# Patient Record
Sex: Female | Born: 1990 | Race: White | Hispanic: No | Marital: Married | Smoking: Never smoker
Health system: Southern US, Community
[De-identification: ages and names within clinical notes are randomized; demographics above are authoritative.]

## PROBLEM LIST (undated history)

## (undated) DIAGNOSIS — J302 Other seasonal allergic rhinitis: Secondary | ICD-10-CM

## (undated) DIAGNOSIS — O24419 Gestational diabetes mellitus in pregnancy, unspecified control: Secondary | ICD-10-CM

## (undated) DIAGNOSIS — R519 Headache, unspecified: Secondary | ICD-10-CM

## (undated) DIAGNOSIS — R51 Headache: Secondary | ICD-10-CM

## (undated) HISTORY — PX: WISDOM TOOTH EXTRACTION: SHX21

---
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
## 2008-10-27 ENCOUNTER — Inpatient Hospital Stay (HOSPITAL_COMMUNITY): Admission: AD | Admit: 2008-10-27 | Discharge: 2008-10-29 | Payer: Self-pay | Admitting: Obstetrics and Gynecology

## 2010-04-04 ENCOUNTER — Emergency Department (HOSPITAL_COMMUNITY): Admission: EM | Admit: 2010-04-04 | Discharge: 2010-04-04 | Payer: Self-pay | Admitting: Emergency Medicine

## 2010-11-26 ENCOUNTER — Inpatient Hospital Stay (HOSPITAL_COMMUNITY)
Admission: RE | Admit: 2010-11-26 | Discharge: 2010-11-28 | DRG: 775 | Disposition: A | Discharge status: Home / Self Care | Payer: Medicaid Other | Source: Ambulatory Visit | Attending: Obstetrics and Gynecology | Admitting: Obstetrics and Gynecology

## 2010-11-26 LAB — RPR: RPR Ser Ql: REACTIVE — AB

## 2010-11-26 LAB — CBC
HCT: 37.7 % (ref 36.0–46.0)
Hemoglobin: 13 g/dL (ref 12.0–15.0)
MCH: 29.6 pg (ref 26.0–34.0)
MCHC: 34.5 g/dL (ref 30.0–36.0)
MCV: 85.9 fL (ref 78.0–100.0)
Platelets: 227 10*3/uL (ref 150–400)
RBC: 4.39 MIL/uL (ref 3.87–5.11)
RDW: 12.8 % (ref 11.5–15.5)
WBC: 8.8 10*3/uL (ref 4.0–10.5)

## 2010-11-26 LAB — RPR TITER: RPR Titer: 1:1 {titer}

## 2010-11-27 LAB — CBC
Platelets: 201 10*3/uL (ref 150–400)
RDW: 13 % (ref 11.5–15.5)
WBC: 11.3 10*3/uL — ABNORMAL HIGH (ref 4.0–10.5)

## 2010-11-27 LAB — T.PALLIDUM AB, IGG: T pallidum Antibodies (TP-PA): 0.03 S/CO (ref <0.90)

## 2010-12-01 NOTE — Discharge Summary (Signed)
  [name], [name]                ACCOUNT NO.:  [id number]  MEDICAL RECORD NO.:  [id number]           PATIENT TYPE:  I  LOCATION:  9121                          FACILITY:  [facility]  PHYSICIAN:  Huel Cote, M.D. DATE OF BIRTH:  12/23/1990  DATE OF ADMISSION:  11/26/2010 DATE OF DISCHARGE:  11/28/2010                              DISCHARGE SUMMARY   DISCHARGE DIAGNOSES: 1. Term pregnancy at 39+ weeks, delivered. 2. Status post normal spontaneous vaginal delivery.  DISCHARGE MEDICATIONS: 1. Motrin 600 mg p.o. every 6 hours. 2. Percocet one to two tablets p.o. every 4 hours p.r.n.  DISCHARGE FOLLOWUP:  The patient is to follow up in the office in 6 weeks for her full postpartum exam.  Discharge hemoglobin 11.2.  HOSPITAL COURSE:  The patient is a 20 year old G2, P1-0-0-1 who came in at [redacted] weeks gestation for induction of labor given term status in a favorable cervix.  Prenatal care was overall uncomplicated.  She had a false positive RPR and her 1-hour Glucola was elevated, but she had a 3- hour Glucola performed at 29 and 33 weeks, which was normal except for one elevated value on both.  She plans Mirena postpartum.  Prenatal labs are as follows:  A+ antibody negative, rubella equivocal, RPR positive, but treponemal test was negative, hepatitis B surface antigen negative, HIV negative, GC negative, Chlamydia negative, 1-hour Glucola 145, 3-hour Glucola normal x2, group B strep negative, first trimester screen normal.  PAST OBSTETRICAL HISTORY:  In 2010, she had a 39-week delivery of a 7 pounds and 9 ounces infant.  PAST GYN HISTORY:  None.  PAST SURGICAL HISTORY:  None.  PAST MEDICAL HISTORY:  Depression in the past, requiring no medications currently.  MEDICATIONS:  Prenatal vitamins.  There are no allergies.  She has no tobacco, alcohol or drugs.  PHYSICAL EXAMINATION:  On admission, she is afebrile with stable vital signs.  Cervix was 60-70% effaced, 3+ cm and  a -2 station.  She had rupture of membranes with clear fluid noted.  She progressed quickly throughout morning reached complete dilation and pushed well with a normal spontaneous vaginal delivery of a vigorous female infant over an intact perineum, Apgars were 8 and 9, weight is 8 pounds and 10 ounces. Placenta delivery was spontaneous.  Cervix and rectum were intact.  The patient was admitted for routine postpartum care and did well on postpartum day #2.  Her pain was well controlled.  Bleeding was normal and she was felt stable for discharge home.  She was given instructions on pelvic rest and planned a Mirena at her 6-week postpartum visit.  She will also be bring the baby to the office for outpatient circumcision next week.     Huel Cote, M.D.     KR/MEDQ  D:  11/28/2010  T:  11/29/2010  Job:  [id number]  Electronically Signed by Huel Cote M.D. on 12/01/2010 10:43:22 AM

## 2011-01-01 LAB — CBC
HCT: 35.7 % — ABNORMAL LOW (ref 36.0–46.0)
Hemoglobin: 12 g/dL (ref 12.0–15.0)
MCHC: 33.5 g/dL (ref 30.0–36.0)
Platelets: 229 10*3/uL (ref 150–400)
RBC: 3.96 MIL/uL (ref 3.87–5.11)
RBC: 4.1 MIL/uL (ref 3.87–5.11)
WBC: 10.9 10*3/uL — ABNORMAL HIGH (ref 4.0–10.5)
WBC: 13.3 10*3/uL — ABNORMAL HIGH (ref 4.0–10.5)

## 2011-01-01 LAB — RPR: RPR Ser Ql: NONREACTIVE

## 2011-01-29 NOTE — Discharge Summary (Signed)
NAMELAWAN, NANEZ                ACCOUNT NO.:  [id number]   MEDICAL RECORD NO.:  [id number]          PATIENT TYPE:  INP   LOCATION:  9105                          FACILITY:  [facility]   PHYSICIAN:  Malachi Pro. Ambrose Mantle, M.D. DATE OF BIRTH:  January 04, 1991   DATE OF ADMISSION:  10/27/2008  DATE OF DISCHARGE:                               DISCHARGE SUMMARY   A 20 year old white female para 0 gravida 1, estimated gestational age  [redacted] weeks by last period compatible with a 8-week ultrasound with [facility]  November 03, 2008, presented complaining of spontaneous rupture of  membranes at home at approximately 3:30 a.m., occasional contractions.  She was evaluated in the maternity admission unit, rupture of membranes  was confirmed, the cervix was 2-3 cm dilated.  Blood group and type was  A positive with a negative antibody, RPR nonreactive, hepatitis B  surface antigen negative, rubella equivocal.  The patient had an ASCUS  Pap smear with positive HPV.  GC and chlamydia negative, cystic fibrosis  negative.  First trimester screen normal.  Maternal serum alpha  fetoprotein was normal.  A 1-hour Glucola 129.  Group B strep negative.  Prenatal care was essentially uncomplicated.   PAST MEDICAL HISTORY:  No known drug allergies.  No surgeries.  History  of depression, on no medications.   PHYSICAL EXAMINATION:  VITAL SIGNS:  On admission, afebrile with normal  vital signs.  HEART/LUNGS:  Normal.  ABDOMEN:  Soft, gravid, nontender.  Estimated fetal weight 8 pounds.  Cervix 2-3 cm dilated per the RN, vertex presentation.  Pitocin  augmentation was begun.  By 1:40 p.m., the cervix was 3 cm, completely  effaced, vertex at a -1 station per the RN.  At 5:50 p.m., the cervix  was 5-6 cm.  The patient progressed to complete dilatation and pushed  well.  She had a spontaneous vaginal delivery of a living female infant,  7 pounds 9 ounces, Apgars of 9 at 1 and 9 at 5 minutes.  Dr. Jackelyn Knife  and was in attendance.   Placenta was spontaneous and intact.  She did  have a cord blood donation, right labial laceration, and small first-  degree perineal lacerations were hemostatic and not repaired.  Mild  uterine atony responded to massage, Pitocin, and IM Methergine.  Blood  loss about 600 mL.  Postpartum, the patient did very well and was  discharged on the second postpartum day.  RPR was nonreactive.  Admission hemoglobin 12.4, hematocrit 37, white count 10,900, platelet  count 257,000.  Followup hemoglobin 12.0, hematocrit 35.7.   FINAL DIAGNOSES:  Intrauterine pregnancy at 39 weeks, delivered vertex,  premature rupture of membranes.   OPERATION:  Spontaneous delivery vertex.   FINAL CONDITION:  Improved.   INSTRUCTIONS:  Our regular discharge instruction booklet.  The patient  declines analgesics for discharge.  She is asked to return in 6 weeks  for followup examination.      Malachi Pro. Ambrose Mantle, M.D.  Electronically Signed    TFH/MEDQ  D:  10/29/2008  T:  10/29/2008  Job:  [id number]

## 2011-08-31 ENCOUNTER — Emergency Department: Payer: Self-pay | Admitting: Unknown Physician Specialty

## 2015-09-17 NOTE — L&D Delivery Note (Signed)
Delivery Note Pt reached complete and pushed great.  At 4:04 PM a healthy female was delivered via Vaginal, Spontaneous Delivery (Presentation: OA  ).  APGAR: 8, 9; weight pending .   Placenta status:  delivered spontaneously, .  Cord:  with the following complications:nuchal x 1 reduced .    Anesthesia:  epidural Episiotomy: None Lacerations: None Suture Repair: n/a Est. Blood Loss (mL): 200ml  Mom to postpartum.  Baby to Couplet care / Skin to Skin.  D/w pt ppBTL in detail.. D/w her risks of surgical injusry to surrounding bowel and bladder and also risk of failure of 1:100.  We discussed increased risk of ectopic should pregnancy occur.  OR notified, has cases going on this PM.  Available 1300 pm on 05/16/16.  Dr. Mindi SlickerBanga agrees to perform.    Oliver PilaRICHARDSON,Caz Weaver W 05/15/2016, 4:34 PM

## 2016-03-05 LAB — OB RESULTS CONSOLE HIV ANTIBODY (ROUTINE TESTING): HIV: NONREACTIVE

## 2016-03-05 LAB — OB RESULTS CONSOLE HEPATITIS B SURFACE ANTIGEN: Hepatitis B Surface Ag: NEGATIVE

## 2016-03-05 LAB — OB RESULTS CONSOLE GC/CHLAMYDIA
Chlamydia: NEGATIVE
GC PROBE AMP, GENITAL: NEGATIVE

## 2016-03-05 LAB — OB RESULTS CONSOLE RPR: RPR: NONREACTIVE

## 2016-03-05 LAB — OB RESULTS CONSOLE RUBELLA ANTIBODY, IGM: Rubella: IMMUNE

## 2016-03-18 ENCOUNTER — Encounter: Payer: Medicaid Other | Attending: Obstetrics and Gynecology | Admitting: Skilled Nursing Facility1

## 2016-03-18 ENCOUNTER — Encounter: Payer: Self-pay | Admitting: Skilled Nursing Facility1

## 2016-03-18 VITALS — Ht 63.0 in | Wt 221.0 lb

## 2016-03-18 DIAGNOSIS — Z713 Dietary counseling and surveillance: Secondary | ICD-10-CM | POA: Insufficient documentation

## 2016-03-18 DIAGNOSIS — O24410 Gestational diabetes mellitus in pregnancy, diet controlled: Secondary | ICD-10-CM

## 2016-03-18 DIAGNOSIS — O99810 Abnormal glucose complicating pregnancy: Secondary | ICD-10-CM | POA: Diagnosis present

## 2016-03-18 NOTE — Progress Notes (Signed)
  Patient was seen on 03/18/2016 for Gestational Diabetes self-management class at the [facility]. The following learning objectives were met by the patient during this course:   States the definition of Gestational Diabetes  States why dietary management is important in controlling blood glucose  Describes the effects each nutrient has on blood glucose levels  Demonstrates ability to create a balanced meal plan  Demonstrates carbohydrate counting   States when to check blood glucose levels involving a total of 4 separate occurences in a day  Demonstrates proper blood glucose monitoring techniques  States the effect of stress and exercise on blood glucose levels  States the importance of limiting caffeine and abstaining from alcohol and smoking  Demonstrates the knowledge the glucometer provided in class may not be covered by their insurance and to call their insurance provider immediately after class to know which glucometer their insurance provider does cover as well as calling their physician the next day for a prescription to the glucometer their insurance does cover (if the one provided is not) as well as the lancets and strips for that meter.  Blood glucose monitor given: accucheck guide Lot # [id number] Exp: 04/02/2017 Blood glucose reading: 92  Patient instructed to monitor glucose levels: FBS: 60 - <90 1 hour: <140 2 hour: <120  *Patient received handouts:  Nutrition Diabetes and Pregnancy  Carbohydrate Counting List  Patient will be seen for follow-up as needed.

## 2016-05-03 LAB — OB RESULTS CONSOLE GBS: STREP GROUP B AG: NEGATIVE

## 2016-05-15 ENCOUNTER — Encounter (HOSPITAL_COMMUNITY): Payer: Self-pay | Admitting: *Deleted

## 2016-05-15 ENCOUNTER — Inpatient Hospital Stay (HOSPITAL_COMMUNITY): Payer: Medicaid Other | Admitting: Anesthesiology

## 2016-05-15 ENCOUNTER — Inpatient Hospital Stay (HOSPITAL_COMMUNITY)
Admission: AD | Admit: 2016-05-15 | Discharge: 2016-05-17 | DRG: 767 | Disposition: A | Discharge status: Home / Self Care | Payer: Medicaid Other | Source: Ambulatory Visit | Attending: Obstetrics and Gynecology | Admitting: Obstetrics and Gynecology

## 2016-05-15 DIAGNOSIS — Z302 Encounter for sterilization: Secondary | ICD-10-CM | POA: Diagnosis not present

## 2016-05-15 DIAGNOSIS — Z3A37 37 weeks gestation of pregnancy: Secondary | ICD-10-CM

## 2016-05-15 DIAGNOSIS — IMO0001 Reserved for inherently not codable concepts without codable children: Secondary | ICD-10-CM

## 2016-05-15 DIAGNOSIS — O24425 Gestational diabetes mellitus in childbirth, controlled by oral hypoglycemic drugs: Principal | ICD-10-CM | POA: Diagnosis present

## 2016-05-15 DIAGNOSIS — Z3483 Encounter for supervision of other normal pregnancy, third trimester: Secondary | ICD-10-CM | POA: Diagnosis present

## 2016-05-15 HISTORY — DX: Other seasonal allergic rhinitis: J30.2

## 2016-05-15 HISTORY — DX: Headache, unspecified: R51.9

## 2016-05-15 HISTORY — DX: Headache: R51

## 2016-05-15 HISTORY — DX: Gestational diabetes mellitus in pregnancy, unspecified control: O24.419

## 2016-05-15 LAB — CBC
HCT: 38.9 % (ref 36.0–46.0)
Hemoglobin: 14.1 g/dL (ref 12.0–15.0)
MCH: 30.9 pg (ref 26.0–34.0)
MCHC: 36.2 g/dL — ABNORMAL HIGH (ref 30.0–36.0)
MCV: 85.3 fL (ref 78.0–100.0)
PLATELETS: 251 10*3/uL (ref 150–400)
RBC: 4.56 MIL/uL (ref 3.87–5.11)
RDW: 12.8 % (ref 11.5–15.5)
WBC: 13.1 10*3/uL — AB (ref 4.0–10.5)

## 2016-05-15 MED ORDER — EPHEDRINE 5 MG/ML INJ
10.0000 mg | INTRAVENOUS | Status: DC | PRN
  Filled 2016-05-15: qty 4

## 2016-05-15 MED ORDER — OXYTOCIN 40 UNITS IN LACTATED RINGERS INFUSION - SIMPLE MED
2.5000 [IU]/h | INTRAVENOUS | Status: DC
  Filled 2016-05-15: qty 1000

## 2016-05-15 MED ORDER — OXYCODONE HCL 5 MG PO TABS
5.0000 mg | ORAL_TABLET | ORAL | Status: DC | PRN
  Administered 2016-05-15 – 2016-05-16 (×3): 5 mg via ORAL
  Filled 2016-05-15 (×3): qty 1

## 2016-05-15 MED ORDER — DIBUCAINE 1 % RE OINT
1.0000 | TOPICAL_OINTMENT | RECTAL | Status: DC | PRN
Start: 2016-05-15 — End: 2016-05-17

## 2016-05-15 MED ORDER — IBUPROFEN 600 MG PO TABS
600.0000 mg | ORAL_TABLET | Freq: Four times a day (QID) | ORAL | Status: DC
  Administered 2016-05-15 – 2016-05-17 (×6): 600 mg via ORAL
  Filled 2016-05-15 (×6): qty 1

## 2016-05-15 MED ORDER — TETANUS-DIPHTH-ACELL PERTUSSIS 5-2.5-18.5 LF-MCG/0.5 IM SUSP: 0.5000 mL | Freq: Once | INTRAMUSCULAR | Status: DC

## 2016-05-15 MED ORDER — SIMETHICONE 80 MG PO CHEW: 80.0000 mg | CHEWABLE_TABLET | ORAL | Status: DC | PRN

## 2016-05-15 MED ORDER — OXYTOCIN BOLUS FROM INFUSION: 500.0000 mL | Freq: Once | INTRAVENOUS | Status: DC

## 2016-05-15 MED ORDER — ACETAMINOPHEN 325 MG PO TABS: 650.0000 mg | ORAL_TABLET | ORAL | Status: DC | PRN

## 2016-05-15 MED ORDER — LACTATED RINGERS IV SOLN
500.0000 mL | Freq: Once | INTRAVENOUS | Status: AC
  Administered 2016-05-15: 500 mL via INTRAVENOUS

## 2016-05-15 MED ORDER — PHENYLEPHRINE 40 MCG/ML (10ML) SYRINGE FOR IV PUSH (FOR BLOOD PRESSURE SUPPORT)
80.0000 ug | PREFILLED_SYRINGE | INTRAVENOUS | Status: DC | PRN
  Filled 2016-05-15: qty 5

## 2016-05-15 MED ORDER — ONDANSETRON HCL 4 MG/2ML IJ SOLN: 4.0000 mg | INTRAMUSCULAR | Status: DC | PRN

## 2016-05-15 MED ORDER — FENTANYL 2.5 MCG/ML BUPIVACAINE 1/10 % EPIDURAL INFUSION (WH - ANES)
14.0000 mL/h | INTRAMUSCULAR | Status: DC | PRN
  Administered 2016-05-15: 14 mL/h via EPIDURAL
  Filled 2016-05-15 (×2): qty 125

## 2016-05-15 MED ORDER — FLEET ENEMA 7-19 GM/118ML RE ENEM: 1.0000 | ENEMA | RECTAL | Status: DC | PRN

## 2016-05-15 MED ORDER — ONDANSETRON HCL 4 MG PO TABS: 4.0000 mg | ORAL_TABLET | ORAL | Status: DC | PRN

## 2016-05-15 MED ORDER — WITCH HAZEL-GLYCERIN EX PADS: 1.0000 "application " | MEDICATED_PAD | CUTANEOUS | Status: DC | PRN

## 2016-05-15 MED ORDER — FENTANYL 2.5 MCG/ML BUPIVACAINE 1/10 % EPIDURAL INFUSION (WH - ANES)
14.0000 mL/h | INTRAMUSCULAR | Status: DC | PRN
  Administered 2016-05-15: 14 mL/h via EPIDURAL

## 2016-05-15 MED ORDER — LACTATED RINGERS IV SOLN: 500.0000 mL | INTRAVENOUS | Status: DC | PRN

## 2016-05-15 MED ORDER — LIDOCAINE HCL (PF) 1 % IJ SOLN
30.0000 mL | INTRAMUSCULAR | Status: DC | PRN
  Filled 2016-05-15: qty 30

## 2016-05-15 MED ORDER — OXYCODONE HCL 5 MG PO TABS: 10.0000 mg | ORAL_TABLET | ORAL | Status: DC | PRN

## 2016-05-15 MED ORDER — COCONUT OIL OIL: 1.0000 "application " | TOPICAL_OIL | Status: DC | PRN

## 2016-05-15 MED ORDER — PRENATAL MULTIVITAMIN CH
1.0000 | ORAL_TABLET | Freq: Every day | ORAL | Status: DC
  Administered 2016-05-17: 1 via ORAL
  Filled 2016-05-15: qty 1

## 2016-05-15 MED ORDER — OXYCODONE-ACETAMINOPHEN 5-325 MG PO TABS: 2.0000 | ORAL_TABLET | ORAL | Status: DC | PRN

## 2016-05-15 MED ORDER — ONDANSETRON HCL 4 MG/2ML IJ SOLN: 4.0000 mg | Freq: Four times a day (QID) | INTRAMUSCULAR | Status: DC | PRN

## 2016-05-15 MED ORDER — LIDOCAINE HCL (PF) 1 % IJ SOLN
INTRAMUSCULAR | Status: DC | PRN
Start: 2016-05-15 — End: 2016-05-15
  Administered 2016-05-15 (×2): 5 mL

## 2016-05-15 MED ORDER — OXYTOCIN 40 UNITS IN LACTATED RINGERS INFUSION - SIMPLE MED
1.0000 m[IU]/min | INTRAVENOUS | Status: DC
  Administered 2016-05-15: 2 m[IU]/min via INTRAVENOUS

## 2016-05-15 MED ORDER — LACTATED RINGERS IV SOLN
INTRAVENOUS | Status: DC
  Administered 2016-05-16 (×2): via INTRAVENOUS

## 2016-05-15 MED ORDER — TERBUTALINE SULFATE 1 MG/ML IJ SOLN
0.2500 mg | Freq: Once | INTRAMUSCULAR | Status: DC | PRN
  Filled 2016-05-15: qty 1

## 2016-05-15 MED ORDER — SOD CITRATE-CITRIC ACID 500-334 MG/5ML PO SOLN: 30.0000 mL | ORAL | Status: DC | PRN

## 2016-05-15 MED ORDER — LACTATED RINGERS IV SOLN
INTRAVENOUS | Status: DC
Start: 2016-05-15 — End: 2016-05-15
  Administered 2016-05-15 (×2): via INTRAVENOUS

## 2016-05-15 MED ORDER — BENZOCAINE-MENTHOL 20-0.5 % EX AERO
1.0000 | INHALATION_SPRAY | CUTANEOUS | Status: DC | PRN
Start: 2016-05-15 — End: 2016-05-17

## 2016-05-15 MED ORDER — PHENYLEPHRINE 40 MCG/ML (10ML) SYRINGE FOR IV PUSH (FOR BLOOD PRESSURE SUPPORT)
80.0000 ug | PREFILLED_SYRINGE | INTRAVENOUS | Status: DC | PRN
  Filled 2016-05-15: qty 10
  Filled 2016-05-15: qty 5

## 2016-05-15 MED ORDER — OXYCODONE-ACETAMINOPHEN 5-325 MG PO TABS
1.0000 | ORAL_TABLET | ORAL | Status: DC | PRN
Start: 2016-05-15 — End: 2016-05-15

## 2016-05-15 MED ORDER — DIPHENHYDRAMINE HCL 50 MG/ML IJ SOLN: 12.5000 mg | INTRAMUSCULAR | Status: DC | PRN

## 2016-05-15 MED ORDER — ZOLPIDEM TARTRATE 5 MG PO TABS: 5.0000 mg | ORAL_TABLET | Freq: Every evening | ORAL | Status: DC | PRN

## 2016-05-15 MED ORDER — LACTATED RINGERS IV SOLN: 500.0000 mL | Freq: Once | INTRAVENOUS | Status: DC

## 2016-05-15 MED ORDER — DIPHENHYDRAMINE HCL 25 MG PO CAPS: 25.0000 mg | ORAL_CAPSULE | Freq: Four times a day (QID) | ORAL | Status: DC | PRN

## 2016-05-15 MED ORDER — SENNOSIDES-DOCUSATE SODIUM 8.6-50 MG PO TABS
2.0000 | ORAL_TABLET | ORAL | Status: DC
  Administered 2016-05-15: 2 via ORAL
  Filled 2016-05-15: qty 2

## 2016-05-15 NOTE — Anesthesia Procedure Notes (Signed)
Epidural Patient location during procedure: OB Start time: 05/15/2016 10:17 AM End time: 05/15/2016 10:22 AM  Staffing Anesthesiologist: Bonita QuinGUIDETTI, Khrystal Jeanmarie S Performed: anesthesiologist   Preanesthetic Checklist Completed: patient identified, site marked, surgical consent, pre-op evaluation, timeout performed, IV checked, risks and benefits discussed and monitors and equipment checked  Epidural Patient position: sitting Prep: site prepped and draped and DuraPrep Patient monitoring: continuous pulse ox and blood pressure Approach: midline Location: L4-L5 Injection technique: LOR air  Needle:  Needle type: Tuohy  Needle gauge: 17 G Needle length: 9 cm and 9 Needle insertion depth: 5 cm cm Catheter type: closed end flexible Catheter size: 19 Gauge Catheter at skin depth: 12 cm Test dose: negative  Assessment Events: blood not aspirated, injection not painful, no injection resistance, negative IV test and no paresthesia

## 2016-05-15 NOTE — Anesthesia Rounding Note (Signed)
  CRNA Epidural Rounding Note  Patient: [name], 25 y.o., female  Patient's current pain level: 0  Agreed upon pain management level: 4  Epidural intervention: No   Comments: Patient comfortable  [facility] City MckinneyEIGHT,[name] 05/15/2016

## 2016-05-15 NOTE — Anesthesia Preprocedure Evaluation (Signed)
Anesthesia Evaluation  Patient identified by MRN, date of birth, ID band Patient awake    Reviewed: Allergy & Precautions, NPO status , Patient's Chart, lab work & pertinent test results  Airway Mallampati: III  TM Distance: >3 FB Neck ROM: Full    Dental no notable dental hx.    Pulmonary neg pulmonary ROS,    Pulmonary exam normal        Cardiovascular negative cardio ROS Normal cardiovascular exam     Neuro/Psych negative neurological ROS  negative psych ROS   GI/Hepatic negative GI ROS, Neg liver ROS,   Endo/Other  diabetes, Gestational  Renal/GU negative Renal ROS  negative genitourinary   Musculoskeletal negative musculoskeletal ROS (+)   Abdominal   Peds negative pediatric ROS (+)  Hematology negative hematology ROS (+)   Anesthesia Other Findings   Reproductive/Obstetrics (+) Pregnancy                             Anesthesia Physical Anesthesia Plan  ASA: II  Anesthesia Plan: Epidural   Post-op Pain Management:    Induction:   Airway Management Planned:   Additional Equipment:   Intra-op Plan:   Post-operative Plan:   Informed Consent:   Plan Discussed with:   Anesthesia Plan Comments:         Anesthesia Quick Evaluation

## 2016-05-15 NOTE — H&P (Signed)
Gabriela ReapCarolyn Plaugher is a 25 y.o. female G3P2002 at 4637 2/7 weeks (EDD 06/03/16 by    presenting for contractions and ROM in active labor.  Cervix 5cm on admission.Prenatal care complicated by GDM which required oral agent to control.  She is on metformin 500mg  po BID since about 33 weeks with reasonable control.  She also had a false positive RPR with negative treponemal test.  She would like permanent sterilization either with a pp or interval tubal.    OB History    Gravida Para Term Preterm AB Living   3 2 2     2    SAB TAB Ectopic Multiple Live Births           2    NSVD 2010  NSVD 2012  Past Medical History:  Diagnosis Date  . Gestational diabetes   . Headache   . Seasonal allergies    Past Surgical History:  Procedure Laterality Date  . WISDOM TOOTH EXTRACTION     Family History: family history is not on file. Social History:  reports that she has never smoked. She has never used smokeless tobacco. She reports that she does not drink alcohol or use drugs.     Maternal Diabetes: Yes:  Diabetes Type:  Insulin/Medication controlled Genetic Screening: Normal Maternal Ultrasounds/Referrals: Normal Fetal Ultrasounds or other Referrals:  None Maternal Substance Abuse:  No Significant Maternal Medications:  Meds include: Other: Metformin Significant Maternal Lab Results:  None Other Comments:  None  Review of Systems  Neurological: Negative for headaches.   Maternal Medical History:  Reason for admission: Rupture of membranes and contractions.   Contractions: Onset was 3-5 hours ago.   Frequency: regular.   Perceived severity is moderate.    Fetal activity: Perceived fetal activity is normal.    Prenatal Complications - Diabetes: gestational. Diabetes is managed by oral agent (monotherapy).      Dilation: 5 Effacement (%): 70 Station: 0 Exam by:: Morrison Oldee Carter RN Blood pressure 121/77, pulse 99, temperature 98.5 F (36.9 C), temperature source Oral, resp. rate  18. Maternal Exam:  Uterine Assessment: Contraction strength is moderate.  Contraction frequency is regular.   Abdomen: Patient reports no abdominal tenderness. Fetal presentation: vertex  Introitus: Normal vulva. Normal vagina.    Physical Exam  Constitutional: She appears well-developed and well-nourished.  Cardiovascular: Normal rate.   Respiratory: Effort normal.  GI: Soft.  Genitourinary: Vagina normal.  Neurological: She is alert.  Psychiatric: She has a normal mood and affect.    Prenatal labs: ABO, Rh:  A postive Antibody:  negative Rubella:  Immune RPR:   False +--trepomnemal negative HBsAg:  Neg  HIV:   NR GBS:   Negative One hour GCT 140 Three hour GTT c/w GDM First Trimester screen and AFP negative   Assessment/Plan: Pt admitted in active labor and trying to get epidural.  BS 90 this AM per pt.  Oliver PilaRICHARDSON,Grayden Burley W 05/15/2016, 9:46 AM

## 2016-05-15 NOTE — Progress Notes (Signed)
Patient ID: Gabriela ReapCarolyn Walters, female   DOB: 10-24-1990, 25 y.o.   MRN: [id number] Comfortable with epidural  afeb vss Cervix c/5/0 FHR category 1  IUPC placed and will start pitocin if not adequate given no recent cervical change.

## 2016-05-15 NOTE — MAU Note (Signed)
Started leaking and contracting around 0700 this morning. Clear fluid. Some spotting.  Was 2 cm when last checked. Gest DM.  Fasting this morning was 90.

## 2016-05-16 ENCOUNTER — Inpatient Hospital Stay (HOSPITAL_COMMUNITY): Payer: Medicaid Other | Admitting: Anesthesiology

## 2016-05-16 ENCOUNTER — Encounter (HOSPITAL_COMMUNITY)
Admission: AD | Disposition: A | Discharge status: Home / Self Care | Payer: Self-pay | Source: Ambulatory Visit | Attending: Obstetrics and Gynecology

## 2016-05-16 DIAGNOSIS — Z302 Encounter for sterilization: Secondary | ICD-10-CM

## 2016-05-16 HISTORY — PX: TUBAL LIGATION: SHX77

## 2016-05-16 LAB — CBC
HCT: 33.4 % — ABNORMAL LOW (ref 36.0–46.0)
Hemoglobin: 11.9 g/dL — ABNORMAL LOW (ref 12.0–15.0)
MCH: 30.6 pg (ref 26.0–34.0)
MCHC: 35.6 g/dL (ref 30.0–36.0)
MCV: 85.9 fL (ref 78.0–100.0)
PLATELETS: 223 10*3/uL (ref 150–400)
RBC: 3.89 MIL/uL (ref 3.87–5.11)
RDW: 13 % (ref 11.5–15.5)
WBC: 11.5 10*3/uL — ABNORMAL HIGH (ref 4.0–10.5)

## 2016-05-16 LAB — GLUCOSE, CAPILLARY: Glucose-Capillary: 77 mg/dL (ref 65–99)

## 2016-05-16 LAB — RPR, QUANT+TP ABS (REFLEX): T Pallidum Abs: NEGATIVE

## 2016-05-16 LAB — RPR: RPR: REACTIVE — AB

## 2016-05-16 SURGERY — LIGATION, FALLOPIAN TUBE, POSTPARTUM
Anesthesia: Epidural | Laterality: Bilateral

## 2016-05-16 MED ORDER — LACTATED RINGERS IV SOLN
INTRAVENOUS | Status: DC | PRN
  Administered 2016-05-16 (×2): via INTRAVENOUS

## 2016-05-16 MED ORDER — OXYCODONE HCL 5 MG PO TABS: 5.0000 mg | ORAL_TABLET | Freq: Once | ORAL | Status: DC | PRN

## 2016-05-16 MED ORDER — ONDANSETRON HCL 4 MG/2ML IJ SOLN
INTRAMUSCULAR | Status: DC | PRN
  Administered 2016-05-16: 4 mg via INTRAVENOUS

## 2016-05-16 MED ORDER — OXYCODONE-ACETAMINOPHEN 5-325 MG PO TABS
2.0000 | ORAL_TABLET | ORAL | Status: DC | PRN
  Administered 2016-05-16 – 2016-05-17 (×2): 2 via ORAL
  Filled 2016-05-16 (×2): qty 2

## 2016-05-16 MED ORDER — METOCLOPRAMIDE HCL 10 MG PO TABS
10.0000 mg | ORAL_TABLET | Freq: Once | ORAL | Status: AC
  Administered 2016-05-16: 10 mg via ORAL
  Filled 2016-05-16: qty 1

## 2016-05-16 MED ORDER — OXYCODONE-ACETAMINOPHEN 5-325 MG PO TABS
1.0000 | ORAL_TABLET | ORAL | Status: DC | PRN
  Administered 2016-05-16 – 2016-05-17 (×2): 1 via ORAL
  Filled 2016-05-16 (×2): qty 1

## 2016-05-16 MED ORDER — MIDAZOLAM HCL 2 MG/2ML IJ SOLN
INTRAMUSCULAR | Status: AC
  Filled 2016-05-16: qty 2

## 2016-05-16 MED ORDER — SCOPOLAMINE 1 MG/3DAYS TD PT72
MEDICATED_PATCH | TRANSDERMAL | Status: AC
  Filled 2016-05-16: qty 1

## 2016-05-16 MED ORDER — PROPOFOL 10 MG/ML IV BOLUS
INTRAVENOUS | Status: DC | PRN
  Administered 2016-05-16: 200 mg via INTRAVENOUS

## 2016-05-16 MED ORDER — PROMETHAZINE HCL 25 MG/ML IJ SOLN: 6.2500 mg | INTRAMUSCULAR | Status: DC | PRN

## 2016-05-16 MED ORDER — BUPIVACAINE HCL (PF) 0.25 % IJ SOLN
INTRAMUSCULAR | Status: AC
  Filled 2016-05-16: qty 30

## 2016-05-16 MED ORDER — ONDANSETRON HCL 4 MG/2ML IJ SOLN
INTRAMUSCULAR | Status: AC
  Filled 2016-05-16: qty 2

## 2016-05-16 MED ORDER — FENTANYL CITRATE (PF) 100 MCG/2ML IJ SOLN
INTRAMUSCULAR | Status: DC | PRN
  Administered 2016-05-16: 100 ug via EPIDURAL
  Administered 2016-05-16: 50 ug via INTRAVENOUS

## 2016-05-16 MED ORDER — LACTATED RINGERS IV SOLN: INTRAVENOUS | Status: DC

## 2016-05-16 MED ORDER — MIDAZOLAM HCL 5 MG/5ML IJ SOLN
INTRAMUSCULAR | Status: DC | PRN
  Administered 2016-05-16: 1 mg via INTRAVENOUS

## 2016-05-16 MED ORDER — FENTANYL CITRATE (PF) 100 MCG/2ML IJ SOLN
INTRAMUSCULAR | Status: AC
  Filled 2016-05-16: qty 2

## 2016-05-16 MED ORDER — METOCLOPRAMIDE HCL 5 MG/ML IJ SOLN
INTRAMUSCULAR | Status: AC
  Filled 2016-05-16: qty 2

## 2016-05-16 MED ORDER — SODIUM BICARBONATE 8.4 % IV SOLN
INTRAVENOUS | Status: DC | PRN
  Administered 2016-05-16: 5 mL via EPIDURAL
  Administered 2016-05-16: 3 mL via EPIDURAL
  Administered 2016-05-16: 2 mL via EPIDURAL
  Administered 2016-05-16 (×4): 5 mL via EPIDURAL

## 2016-05-16 MED ORDER — DEXAMETHASONE SODIUM PHOSPHATE 4 MG/ML IJ SOLN
INTRAMUSCULAR | Status: DC | PRN
  Administered 2016-05-16: 4 mg via INTRAVENOUS

## 2016-05-16 MED ORDER — METOCLOPRAMIDE HCL 5 MG/ML IJ SOLN
INTRAMUSCULAR | Status: DC | PRN
  Administered 2016-05-16: 10 mg via INTRAVENOUS

## 2016-05-16 MED ORDER — BUPIVACAINE HCL (PF) 0.25 % IJ SOLN
INTRAMUSCULAR | Status: DC | PRN
  Administered 2016-05-16: 8 mL

## 2016-05-16 MED ORDER — FAMOTIDINE 20 MG PO TABS
40.0000 mg | ORAL_TABLET | Freq: Once | ORAL | Status: AC
  Administered 2016-05-16: 40 mg via ORAL
  Filled 2016-05-16: qty 2

## 2016-05-16 MED ORDER — OXYCODONE HCL 5 MG/5ML PO SOLN
5.0000 mg | Freq: Once | ORAL | Status: DC | PRN
  Filled 2016-05-16: qty 5

## 2016-05-16 MED ORDER — FENTANYL CITRATE (PF) 100 MCG/2ML IJ SOLN
25.0000 ug | INTRAMUSCULAR | Status: DC | PRN
  Administered 2016-05-16: 50 ug via INTRAVENOUS
  Administered 2016-05-16: 25 ug via INTRAVENOUS

## 2016-05-16 MED ORDER — SUCCINYLCHOLINE CHLORIDE 200 MG/10ML IV SOSY
PREFILLED_SYRINGE | INTRAVENOUS | Status: AC
  Filled 2016-05-16: qty 10

## 2016-05-16 MED ORDER — SUCCINYLCHOLINE CHLORIDE 20 MG/ML IJ SOLN
INTRAMUSCULAR | Status: DC | PRN
  Administered 2016-05-16: 120 mg via INTRAVENOUS

## 2016-05-16 MED ORDER — LIDOCAINE-EPINEPHRINE (PF) 2 %-1:200000 IJ SOLN
INTRAMUSCULAR | Status: AC
  Filled 2016-05-16: qty 20

## 2016-05-16 MED ORDER — PHENYLEPHRINE 40 MCG/ML (10ML) SYRINGE FOR IV PUSH (FOR BLOOD PRESSURE SUPPORT)
PREFILLED_SYRINGE | INTRAVENOUS | Status: AC
  Filled 2016-05-16: qty 10

## 2016-05-16 MED ORDER — SODIUM BICARBONATE 8.4 % IV SOLN
INTRAVENOUS | Status: AC
  Filled 2016-05-16: qty 50

## 2016-05-16 MED ORDER — SCOPOLAMINE 1 MG/3DAYS TD PT72
MEDICATED_PATCH | TRANSDERMAL | Status: DC | PRN
  Administered 2016-05-16: 1 via TRANSDERMAL

## 2016-05-16 SURGICAL SUPPLY — 31 items
BENZOIN TINCTURE PRP APPL 2/3 (GAUZE/BANDAGES/DRESSINGS) ×3 IMPLANT
BLADE SURG 11 STRL SS (BLADE) ×3 IMPLANT
CLOSURE WOUND 1/2 X4 (GAUZE/BANDAGES/DRESSINGS) ×1
CLOTH BEACON ORANGE TIMEOUT ST (SAFETY) ×3 IMPLANT
CONTAINER PREFILL 10% NBF 15ML (MISCELLANEOUS) ×6 IMPLANT
DRSG OPSITE POSTOP 3X4 (GAUZE/BANDAGES/DRESSINGS) ×3 IMPLANT
DURAPREP 26ML APPLICATOR (WOUND CARE) ×3 IMPLANT
ELECT REM PT RETURN 9FT ADLT (ELECTROSURGICAL) ×3
ELECTRODE REM PT RTRN 9FT ADLT (ELECTROSURGICAL) ×1 IMPLANT
GLOVE BIO SURGEON STRL SZ 6.5 (GLOVE) ×4 IMPLANT
GLOVE BIO SURGEONS STRL SZ 6.5 (GLOVE) ×2
GLOVE BIOGEL PI IND STRL 7.0 (GLOVE) ×1 IMPLANT
GLOVE BIOGEL PI INDICATOR 7.0 (GLOVE) ×2
GOWN STRL REUS W/TWL LRG LVL3 (GOWN DISPOSABLE) ×6 IMPLANT
NEEDLE HYPO 22GX1.5 SAFETY (NEEDLE) ×3 IMPLANT
NS IRRIG 1000ML POUR BTL (IV SOLUTION) ×3 IMPLANT
PACK ABDOMINAL MINOR (CUSTOM PROCEDURE TRAY) ×3 IMPLANT
PENCIL BUTTON HOLSTER BLD 10FT (ELECTRODE) IMPLANT
PROTECTOR NERVE ULNAR (MISCELLANEOUS) ×3 IMPLANT
SPONGE LAP 4X18 X RAY DECT (DISPOSABLE) ×3 IMPLANT
STRIP CLOSURE SKIN 1/2X4 (GAUZE/BANDAGES/DRESSINGS) ×2 IMPLANT
SUT PLAIN 0 NONE (SUTURE) ×3 IMPLANT
SUT VIC AB 0 CT1 27 (SUTURE) ×2
SUT VIC AB 0 CT1 27XBRD ANBCTR (SUTURE) ×1 IMPLANT
SUT VIC AB 2-0 SH 27 (SUTURE)
SUT VIC AB 2-0 SH 27XBRD (SUTURE) IMPLANT
SUT VIC AB 3-0 FS2 27 (SUTURE) ×3 IMPLANT
SUT VICRYL 0 UR6 27IN ABS (SUTURE) ×3 IMPLANT
SYR CONTROL 10ML LL (SYRINGE) ×3 IMPLANT
TOWEL OR 17X24 6PK STRL BLUE (TOWEL DISPOSABLE) ×6 IMPLANT
TRAY FOLEY CATH SILVER 14FR (SET/KITS/TRAYS/PACK) ×3 IMPLANT

## 2016-05-16 NOTE — Transfer of Care (Signed)
Immediate Anesthesia Transfer of Care Note  Patient: [name]  Procedure(s) Performed: Procedure(s): POST PARTUM TUBAL LIGATION (Bilateral)  Patient Location: PACU  Anesthesia Type:General and Epidural  Level of Consciousness: awake, alert  and oriented  Airway & Oxygen Therapy: Patient Spontanous Breathing and Patient connected to nasal cannula oxygen  Post-op Assessment: Report given to RN and Post -op Vital signs reviewed and stable  Post vital signs: Reviewed and stable  Last Vitals:  Vitals:   05/16/16 0500 05/16/16 1100  BP: 124/75 125/77  Pulse: 92 82  Resp: 20 20  Temp: 36.8 C 37 C    Last Pain:  Vitals:   05/16/16 1100  TempSrc: Oral  PainSc:          Complications: No apparent anesthesia complications

## 2016-05-16 NOTE — Interval H&P Note (Signed)
History and Physical Interval Note:  05/16/2016 9:24 AM  [name]  has presented today for surgery, with the diagnosis of desires steilization  The various methods of treatment have been discussed with the patient and family including reversible long term options. After consideration of risks, benefits and other options for treatment, the patient has consented to  Procedure(s): POST PARTUM TUBAL LIGATION (Bilateral) as a surgical intervention .  The patient's history has been reviewed, patient examined, no change in status, stable for surgery.  I have reviewed the patient's chart and labs.  Questions were answered to the patient's satisfaction.     Avriel Kandel Medco Health SolutionsWorema Special Ranes

## 2016-05-16 NOTE — OR Nursing (Signed)
Received to Short stay, awake and alert, IV infusing well. Epidural catheter in place/ vital signs stable.

## 2016-05-16 NOTE — Op Note (Signed)
Operative Note    Preoperative Diagnosis: Complete family status - desires permanent sterilization   Postoperative Diagnosis:  same   Procedure: Postpartum bilateral tubal ligation   Surgeon: Dr Mindi SlickerBanga  Anesthesia: general ( converted from epidural)  Fluids: LR 1800cc EBL: 10cc UOP: 400cc  Findings: Grossly nl uterus, tubes and ovaries   Specimen: portion of left and right fallopian tubes   Procedure NotePt to OR where epidural  anesthesia administered and later converted to general for adequate pain control. A foley catheter was placed in sterile fashion. Pt was prepped and draped also using normal sterile procedure. A small infraumbilical incision was made in a curvilinear fashion through the skin using a scalpel. A hemostat was used to separate the underlying fat gently. The fascia was easily identified tented upward with clamps and incised, allowing access into the abdominal cavity. An o-vicryl suture was placed in a pursestring fashion on the fascia and help with a hemostat. There was a thick layer of omentum noted covering the uterine fundus thus this was packed using a lap sponge; better visualization was obtained. The uterine cornua on the left was identified and the fallopian tube gently grabbed with a babcock and followed to its fimbriated end for identification. The ovary was also noted posterior to the tube. The mid isthmus portion of the tube was exteriorized and a 2-3 cm knuckle tied with plain gut suture. A second of the same suture was tied under the first suture. The intervening fallopian tube segment was then cut. Hemostasis noted. The right fallopian tube was similarly identified, followed to its fimbriated end and the mid isthmus portion double ligated and cut with the same suture. Once again hemostasis noted. The peritoneum was then closed by tying the suture.  The fascia was closed next using o-vicryl suture also in a pursestring fashion. Finally the skin incision was  closed with 4-0 vicryl in a subcuticular fashion with excellent cosmesis noted. Steristrips were applied. Counts were noted to be correct. Pt tolerated procedure well. To PACU in stable condition

## 2016-05-16 NOTE — H&P (View-Only) (Signed)
Patient ID: [name], female   DOB: 07/22/1991, 25 y.o.   MRN: [id number] Comfortable with epidural  afeb vss Cervix c/5/0 FHR category 1  IUPC placed and will start pitocin if not adequate given no recent cervical change. 

## 2016-05-16 NOTE — Anesthesia Postprocedure Evaluation (Signed)
Anesthesia Post Note  Patient: [name]  Procedure(s) Performed: Procedure(s) (LRB): POST PARTUM TUBAL LIGATION (Bilateral)  Patient location during evaluation: PACU Anesthesia Type: General Level of consciousness: awake and alert, oriented and patient cooperative Pain management: pain level controlled Vital Signs Assessment: post-procedure vital signs reviewed and stable Respiratory status: spontaneous breathing, nonlabored ventilation and respiratory function stable Cardiovascular status: blood pressure returned to baseline and stable Postop Assessment: no signs of nausea or vomiting and epidural receding Anesthetic complications: no     Last Vitals:  Vitals:   05/16/16 1630 05/16/16 1740  BP: 118/76 125/77  Pulse: 90 82  Resp: 16 18  Temp: 37 C 36.6 C    Last Pain:  Vitals:   05/16/16 1740  TempSrc: Oral  PainSc: 4    Pain Goal: Patients Stated Pain Goal: 3 (05/16/16 1550)               Erling CruzJACKSON,E. Latishia Suitt

## 2016-05-16 NOTE — Progress Notes (Signed)
Patient ID: Gabriela ReapCarolyn Andaya, female   DOB: 1991/06/12, 25 y.o.   MRN: [id number] Pt doing well post op.  Pain controlled No complaints Routine pp care Likely discharge to home in am Pain control prn

## 2016-05-16 NOTE — Anesthesia Postprocedure Evaluation (Signed)
Anesthesia Post Note  Patient: Gabriela ReapCarolyn Verbrugge  Procedure(s) Performed: * No procedures listed *  Patient location during evaluation: Mother Baby Anesthesia Type: Epidural Level of consciousness: awake and alert and oriented Pain management: satisfactory to patient Vital Signs Assessment: post-procedure vital signs reviewed and stable Respiratory status: spontaneous breathing and nonlabored ventilation Cardiovascular status: stable Postop Assessment: no headache, no backache, no signs of nausea or vomiting, adequate PO intake and patient able to bend at knees (patient up walking) Anesthetic complications: no     Last Vitals:  Vitals:   05/15/16 2319 05/16/16 0500  BP: 117/63 124/75  Pulse: 90 92  Resp: 20 20  Temp: 37 C 36.8 C    Last Pain:  Vitals:   05/16/16 0700  TempSrc:   PainSc: 3    Pain Goal:                 [name]

## 2016-05-16 NOTE — Lactation Note (Signed)
This note was copied from a baby's chart. Lactation Consultation Note  Patient Name: Gabriela Jacinto ReapCarolyn Novell UJWJX'BToday's Date: 05/16/2016 Reason for consult: Initial assessment   Initial consult with Exp BF mom of 25 hour old infant. Infant with 7 BF for 15-30 minutes, 1 bottle of formula of 30 cc while mom in surgery for tubal ligation, 5 voids and 6 stools since birth. Infant weight 7 lb 3.7 oz with weight loss of 2% since birth. LATCH Scores 8-9 by bedside RN. Mom GDM on Glyburide with pregnancy.  Mom recently returned from Tubal Ligation. She reports she feels BF is going well. She reports she can hand express colostrum easily and she denies nipple pain. She reports he latches well and sometimes needs stimulation to maintain suckling with feeding. Mom without questions or concerns prn.   BF Resource Handout and LC Brochure given, mom informed of BF Support Groups, LC Phone #, and OP Services. Enc mom to call with questions/concerns prn.      Maternal Data Formula Feeding for Exclusion: No Has patient been taught Hand Expression?: Yes Does the patient have breastfeeding experience prior to this delivery?: Yes  Feeding Feeding Type: Bottle Fed - Formula Nipple Type: Slow - flow  LATCH Score/Interventions                      Lactation Tools Discussed/Used WIC Program: No Pump Review: Setup, frequency, and cleaning   Consult Status Consult Status: Follow-up Date: 05/17/16 Follow-up type: In-patient    Silas FloodSharon S Chosen Geske 05/16/2016, 5:23 PM

## 2016-05-16 NOTE — Progress Notes (Signed)
Post Partum Day 1 Subjective: no complaints, up ad lib, voiding, tolerating PO, + flatus and pain controlled with meds. Bonding well with baby. Breastfeeding  Objective: Blood pressure 124/75, pulse 92, temperature 98.2 F (36.8 C), temperature source Oral, resp. rate 20, height 5\' 3"  (1.6 m), weight 225 lb (102.1 kg).  Physical Exam:  General: alert, cooperative and no distress Lochia: appropriate Uterine Fundus: firm Incision: n/a DVT Evaluation: No evidence of DVT seen on physical exam. No significant calf/ankle edema.   Recent Labs  05/15/16 1000 05/16/16 0529  HGB 14.1 11.9*  HCT 38.9 33.4*    Assessment/Plan: Contraception desires BTL - plan to go to OR today; consent verified and all questions answered.   Routine pp care   LOS: 1 day   [facility] BendCecilia Worema Banga 05/16/2016, 9:25 AM

## 2016-05-16 NOTE — Anesthesia Preprocedure Evaluation (Signed)
Anesthesia Evaluation  Patient identified by MRN, date of birth, ID band Patient awake    Reviewed: Allergy & Precautions, NPO status , Patient's Chart, lab work & pertinent test results  Airway Mallampati: II  TM Distance: >3 FB Neck ROM: Full    Dental no notable dental hx.    Pulmonary neg pulmonary ROS,    Pulmonary exam normal breath sounds clear to auscultation       Cardiovascular negative cardio ROS Normal cardiovascular exam Rhythm:Regular Rate:Normal     Neuro/Psych negative neurological ROS  negative psych ROS   GI/Hepatic negative GI ROS, Neg liver ROS,   Endo/Other  negative endocrine ROSdiabetes  Renal/GU negative Renal ROS  negative genitourinary   Musculoskeletal negative musculoskeletal ROS (+)   Abdominal   Peds negative pediatric ROS (+)  Hematology negative hematology ROS (+)   Anesthesia Other Findings   Reproductive/Obstetrics negative OB ROS                             Anesthesia Physical Anesthesia Plan  ASA: II  Anesthesia Plan: Epidural   Post-op Pain Management:    Induction: Intravenous  Airway Management Planned: Natural Airway  Additional Equipment:   Intra-op Plan:   Post-operative Plan:   Informed Consent: I have reviewed the patients History and Physical, chart, labs and discussed the procedure including the risks, benefits and alternatives for the proposed anesthesia with the patient or authorized representative who has indicated his/her understanding and acceptance.   Dental advisory given  Plan Discussed with: CRNA and Surgeon  Anesthesia Plan Comments:         Anesthesia Quick Evaluation

## 2016-05-16 NOTE — Anesthesia Procedure Notes (Signed)
Procedure Name: Intubation Date/Time: 05/16/2016 2:15 PM Performed by: Graciela HusbandsFUSSELL, Silas Muff O Pre-anesthesia Checklist: Patient identified, Emergency Drugs available, Suction available, Patient being monitored and Timeout performed Patient Re-evaluated:Patient Re-evaluated prior to inductionOxygen Delivery Method: Circle system utilized Preoxygenation: Pre-oxygenation with 100% oxygen Intubation Type: IV induction, Rapid sequence and Cricoid Pressure applied Laryngoscope Size: Mac and 3 Grade View: Grade I Tube type: Oral Tube size: 7.0 mm Number of attempts: 1 Airway Equipment and Method: Stylet Placement Confirmation: ETT inserted through vocal cords under direct vision,  positive ETCO2 and breath sounds checked- equal and bilateral Secured at: 21 cm Tube secured with: Tape Dental Injury: Teeth and Oropharynx as per pre-operative assessment

## 2016-05-17 ENCOUNTER — Encounter (HOSPITAL_COMMUNITY): Payer: Self-pay | Admitting: *Deleted

## 2016-05-17 LAB — CBC
HCT: 33 % — ABNORMAL LOW (ref 36.0–46.0)
Hemoglobin: 11.6 g/dL — ABNORMAL LOW (ref 12.0–15.0)
MCH: 30.4 pg (ref 26.0–34.0)
MCHC: 35.2 g/dL (ref 30.0–36.0)
MCV: 86.4 fL (ref 78.0–100.0)
PLATELETS: 237 10*3/uL (ref 150–400)
RBC: 3.82 MIL/uL — AB (ref 3.87–5.11)
RDW: 12.9 % (ref 11.5–15.5)
WBC: 10.9 10*3/uL — AB (ref 4.0–10.5)

## 2016-05-17 MED ORDER — OXYCODONE-ACETAMINOPHEN 5-325 MG PO TABS: 1.0000 | ORAL_TABLET | Freq: Four times a day (QID) | ORAL | 0 refills | Status: AC | PRN

## 2016-05-17 MED ORDER — PRENATAL MULTIVITAMIN CH: 1.0000 | ORAL_TABLET | Freq: Every day | ORAL | 3 refills | Status: AC

## 2016-05-17 MED ORDER — IBUPROFEN 800 MG PO TABS: 800.0000 mg | ORAL_TABLET | Freq: Three times a day (TID) | ORAL | 1 refills | Status: AC | PRN

## 2016-05-17 NOTE — Progress Notes (Signed)
Post Partum Day 2/PP BTL Day 2 Subjective: no complaints, up ad lib, voiding, tolerating PO and nl lochia, pain controlled  Objective: Blood pressure 118/83, pulse 71, temperature 98.1 F (36.7 C), resp. rate 18, height 5\' 3"  (1.6 m), weight 102.1 kg (225 lb), SpO2 98 %.  Physical Exam:  General: alert and no distress Lochia: appropriate Uterine Fundus: firm Incision: healing well DVT Evaluation: No evidence of DVT seen on physical exam.   Recent Labs  05/16/16 0529 05/17/16 0509  HGB 11.9* 11.6*  HCT 33.4* 33.0*    Assessment/Plan: Discharge home, Breastfeeding and Lactation consult.  Routine care.  D/c with PNV, motrin and percocet.     LOS: 2 days   Bovard-Stuckert, Kylle Lall 05/17/2016, 7:57 AM

## 2016-05-17 NOTE — Addendum Note (Signed)
Addendum  created 05/17/16 0851 by Earmon PhoenixValerie P Catera Hankins, CRNA   Sign clinical note

## 2016-05-17 NOTE — Anesthesia Postprocedure Evaluation (Signed)
Anesthesia Post Note  Patient: [name]  Procedure(s) Performed: Procedure(s) (LRB): POST PARTUM TUBAL LIGATION (Bilateral)  Patient location during evaluation: Mother Baby Anesthesia Type: General Level of consciousness: awake Pain management: pain level controlled Vital Signs Assessment: post-procedure vital signs reviewed and stable Cardiovascular status: stable Postop Assessment: no signs of nausea or vomiting Anesthetic complications: no     Last Vitals:  Vitals:   05/17/16 0159 05/17/16 0541  BP: 104/71 118/83  Pulse: 66 71  Resp: 18 18  Temp: 36.9 C 36.7 C    Last Pain:  Vitals:   05/17/16 0727  TempSrc:   PainSc: 5    Pain Goal: Patients Stated Pain Goal: 3 (05/17/16 0727)               Edison PaceWILKERSON,Karma Ansley

## 2016-05-17 NOTE — Discharge Summary (Signed)
OB Discharge Summary     Patient Name: [name] DOB: 30-Mar-1991 MRN: [id number]  Date of admission: 05/15/2016 Delivering MD: Huel Cote   Date of discharge: 05/17/2016  Admitting diagnosis: CTX desires steilization Intrauterine pregnancy: [redacted]w[redacted]d     Secondary diagnosis:  Active Problems:   Active labor   NSVD (normal spontaneous vaginal delivery)   Request for sterilization  Additional problems: N/A     Discharge diagnosis: Term Pregnancy Delivered and s/p tubal ligation                                                                                                Post partum procedures:N/A  Augmentation: Pitocin  Complications: None  Hospital course:  Onset of Labor With Vaginal Delivery     25 y.o. yo [id number] at [redacted]w[redacted]d was admitted in Active Labor on 05/15/2016. Patient had an uncomplicated labor course as follows:  Membrane Rupture Time/Date: 7:00 AM ,05/15/2016   Intrapartum Procedures: Episiotomy: None [1]                                         Lacerations:  None [1]  Patient had a delivery of a Viable infant. 05/15/2016  Information for the patient's newborn:  Nessa, Ramaker [[id number]]  Delivery Method: Vaginal, Spontaneous Delivery (Filed from Delivery Summary)    Pateint had an uncomplicated postpartum course.  She is ambulating, tolerating a regular diet, passing flatus, and urinating well. Patient is discharged home in stable condition on 05/17/16.    Physical exam Vitals:   05/16/16 1740 05/16/16 2145 05/17/16 0159 05/17/16 0541  BP: 125/77 117/76 104/71 118/83  Pulse: 82 91 66 71  Resp: 18 18 18 18   Temp: 97.9 F (36.6 C) 98.6 F (37 C) 98.5 F (36.9 C) 98.1 F (36.7 C)  TempSrc: Oral  Oral   SpO2: 98% 98%    Weight:      Height:       General: alert and no distress Lochia: appropriate Uterine Fundus: firm Incision: Healing well with no significant drainage DVT Evaluation: No evidence of DVT seen on physical exam. Labs: Lab  Results  Component Value Date   WBC 10.9 (H) 05/17/2016   HGB 11.6 (L) 05/17/2016   HCT 33.0 (L) 05/17/2016   MCV 86.4 05/17/2016   PLT 237 05/17/2016   No flowsheet data found.  Discharge instruction: per After Visit Summary and "Baby and Me Booklet".  After visit meds:    Medication List    STOP taking these medications   metFORMIN 500 MG tablet Commonly known as:  GLUCOPHAGE     TAKE these medications   acetaminophen 325 MG tablet Commonly known as:  TYLENOL Take 650 mg by mouth every 6 (six) hours as needed for headache.   calcium carbonate 500 MG chewable tablet Commonly known as:  TUMS - dosed in mg elemental calcium Chew 2 tablets by mouth at bedtime as needed for indigestion or heartburn.   ibuprofen 800 MG tablet Commonly known as:  ADVIL,MOTRIN Take 1 tablet (800 mg total) by mouth every 8 (eight) hours as needed for moderate pain.   oxyCODONE-acetaminophen 5-325 MG tablet Commonly known as:  PERCOCET/ROXICET Take 1 tablet by mouth every 6 (six) hours as needed (pain scale 4-7).   prenatal multivitamin Tabs tablet Take 1 tablet by mouth daily at 12 noon.       Diet: routine diet  Activity: Advance as tolerated. Pelvic rest for 6 weeks.   Outpatient follow up:6 weeks Follow up Appt:No future appointments. Follow up Visit:No Follow-up on file.  Postpartum contraception: Tubal Ligation  Newborn Data: Live born female  Birth Weight: 7 lb 5.6 oz (3335 g) APGAR: 8, 9  Baby Feeding: Breast Disposition:home with mother   05/17/2016 Sherian ReinBovard-Stuckert, Valentino Saavedra, MD

## 2016-05-19 ENCOUNTER — Encounter (HOSPITAL_COMMUNITY): Payer: Self-pay | Admitting: Obstetrics and Gynecology

## 2016-05-19 LAB — TYPE AND SCREEN
ABO/RH(D): A POS
ANTIBODY SCREEN: POSITIVE
PT AG Type: POSITIVE
Unit division: 0
Unit division: 0

## 2017-12-05 ENCOUNTER — Emergency Department (HOSPITAL_COMMUNITY): Payer: Self-pay

## 2017-12-05 ENCOUNTER — Encounter (HOSPITAL_COMMUNITY): Payer: Self-pay

## 2017-12-05 ENCOUNTER — Emergency Department (HOSPITAL_COMMUNITY)
Admission: EM | Admit: 2017-12-05 | Discharge: 2017-12-05 | Disposition: A | Discharge status: Home / Self Care | Payer: Self-pay | Attending: Emergency Medicine | Admitting: Emergency Medicine

## 2017-12-05 ENCOUNTER — Other Ambulatory Visit: Payer: Self-pay

## 2017-12-05 DIAGNOSIS — W010XXA Fall on same level from slipping, tripping and stumbling without subsequent striking against object, initial encounter: Secondary | ICD-10-CM | POA: Insufficient documentation

## 2017-12-05 DIAGNOSIS — Y998 Other external cause status: Secondary | ICD-10-CM | POA: Insufficient documentation

## 2017-12-05 DIAGNOSIS — Y92000 Kitchen of unspecified non-institutional (private) residence as  the place of occurrence of the external cause: Secondary | ICD-10-CM | POA: Insufficient documentation

## 2017-12-05 DIAGNOSIS — Z79899 Other long term (current) drug therapy: Secondary | ICD-10-CM | POA: Insufficient documentation

## 2017-12-05 DIAGNOSIS — Y9389 Activity, other specified: Secondary | ICD-10-CM | POA: Insufficient documentation

## 2017-12-05 DIAGNOSIS — S92345A Nondisplaced fracture of fourth metatarsal bone, left foot, initial encounter for closed fracture: Secondary | ICD-10-CM | POA: Insufficient documentation

## 2017-12-05 MED ORDER — HYDROCODONE-ACETAMINOPHEN 5-325 MG PO TABS: 1.0000 | ORAL_TABLET | Freq: Four times a day (QID) | ORAL | 0 refills | Status: AC | PRN

## 2017-12-05 NOTE — ED Provider Notes (Signed)
[name] EMERGENCY DEPARTMENT Provider Note   CSN: [id number] Arrival date & time: 12/05/17  1113     History   Chief Complaint Chief Complaint  Patient presents with  . Foot Pain    HPI [name] is a 27 y.o. female presents for evaluation of right foot pain since Tuesday.  He reports that he was in the kitchen when she "stepped down funny" and rolled her ankle.  She reports that she has been trying to "grin and bear it" but that she is still having pain.  She denies any pain by her knee.  She does report bruising and swelling to her left foot.  She has been ambulatory since, however reports that it hurts when she bears weight and she has had difficulty walking because of it.  HPI  Past Medical History:  Diagnosis Date  . Gestational diabetes   . Headache   . Seasonal allergies     Patient Active Problem List   Diagnosis Date Noted  . Request for sterilization 05/16/2016  . Active labor 05/15/2016  . NSVD (normal spontaneous vaginal delivery) 05/15/2016    Past Surgical History:  Procedure Laterality Date  . TUBAL LIGATION Bilateral 05/16/2016   Procedure: POST PARTUM TUBAL LIGATION;  Surgeon: Edwinna Areola, DO;  Location: [facility];  Service: Gynecology;  Laterality: Bilateral;  . WISDOM TOOTH EXTRACTION       OB History    Gravida  3   Para  2   Term  2   Preterm      AB      Living  2     SAB      TAB      Ectopic      Multiple      Live Births  2            Home Medications    Prior to Admission medications   Medication Sig Start Date End Date Taking? Authorizing Provider  acetaminophen (TYLENOL) 325 MG tablet Take 650 mg by mouth every 6 (six) hours as needed for headache.    [provider]  calcium carbonate (TUMS - DOSED IN MG ELEMENTAL CALCIUM) 500 MG chewable tablet Chew 2 tablets by mouth at bedtime as needed for indigestion or heartburn.    [provider]    HYDROcodone-acetaminophen (NORCO/VICODIN) 5-325 MG tablet Take 1 tablet by mouth every 6 (six) hours as needed. 12/05/17   Cristina Gong, PA-C  ibuprofen (ADVIL,MOTRIN) 800 MG tablet Take 1 tablet (800 mg total) by mouth every 8 (eight) hours as needed for moderate pain. 05/17/16   Bovard-Stuckert, Augusto Gamble, MD  oxyCODONE-acetaminophen (PERCOCET/ROXICET) 5-325 MG tablet Take 1 tablet by mouth every 6 (six) hours as needed (pain scale 4-7). 05/17/16   Bovard-Stuckert, Augusto Gamble, MD  Prenatal Vit-Fe Fumarate-FA (PRENATAL MULTIVITAMIN) TABS tablet Take 1 tablet by mouth daily at 12 noon. 05/17/16   Bovard-StuckertAugusto Gamble, MD    Family History Family History  Problem Relation Age of Onset  . Alcohol abuse Neg Hx   . Arthritis Neg Hx   . Asthma Neg Hx   . Birth defects Neg Hx   . Cancer Neg Hx   . COPD Neg Hx   . Depression Neg Hx   . Diabetes Neg Hx   . Drug abuse Neg Hx   . Early death Neg Hx   . Hearing loss Neg Hx   . Heart disease Neg Hx   . Hyperlipidemia Neg Hx   .  Hypertension Neg Hx   . Kidney disease Neg Hx   . Learning disabilities Neg Hx   . Mental illness Neg Hx   . Mental retardation Neg Hx   . Miscarriages / Stillbirths Neg Hx   . Stroke Neg Hx   . Vision loss Neg Hx   . Varicose Veins Neg Hx     Social History Social History   Tobacco Use  . Smoking status: Never Smoker  . Smokeless tobacco: Never Used  Substance Use Topics  . Alcohol use: No  . Drug use: No     Allergies   Patient has no known allergies.   Review of Systems Review of Systems  Constitutional: Negative for chills and fever.  Musculoskeletal:       Pain in left foot  Skin: Negative for color change and wound.  Neurological: Negative for dizziness, weakness and headaches.     Physical Exam Updated Vital Signs BP 133/86 (BP Location: Right Arm)   Pulse 82   Temp 98.8 F (37.1 C) (Oral)   Resp 16   Ht 5\' 3"  (1.6 m)   Wt 92.1 kg (203 lb)   LMP 12/04/2017 (Exact Date)   SpO2 100%    BMI 35.96 kg/m   Physical Exam  Constitutional: She is oriented to person, place, and time. She appears well-developed and well-nourished.  HENT:  Head: Normocephalic and atraumatic.  Cardiovascular: Intact distal pulses.  2+ DP/PT pulses bilaterally.  Musculoskeletal:  There is tenderness to palpation over the lateral aspect of the left foot.  There is no obvious deformities or swelling.   Neurological: She is alert and oriented to person, place, and time.  Sensation intact to left foot  Skin: Skin is warm and dry. She is not diaphoretic.  Skin is intact over the left foot.  Scant bruising.  Psychiatric: She has a normal mood and affect. Her behavior is normal.  Nursing note and vitals reviewed.    ED Treatments / Results  Labs (all labs ordered are listed, but only abnormal results are displayed) Labs Reviewed - No data to display  EKG None  Radiology Dg Foot Complete Left  Result Date: 12/05/2017 CLINICAL DATA:  Left lateral ankle pain and swelling. EXAM: LEFT FOOT - COMPLETE 3+ VIEW COMPARISON:  None. FINDINGS: Acute nondisplaced fracture of the proximal shaft of the fourth metatarsal bone. No significant angulation. Associated soft tissue swelling. Normal bone mineralization. IMPRESSION: Acute nondisplaced fracture of the proximal shaft of the left fourth metatarsal bone. Electronically Signed   By: Ted Mcalpineobrinka  Dimitrova M.D.   On: 12/05/2017 12:11    Procedures Procedures (including critical care time)  Medications Ordered in ED Medications - No data to display   Initial Impression / Assessment and Plan / ED Course  I have reviewed the triage vital signs and the nursing notes.  Pertinent labs & imaging results that were available during my care of the patient were reviewed by me and considered in my medical decision making (see chart for details).    Patient presents today for evaluation of left foot pain which began after she stepped funny while in her kitchen a  days ago.  Sensation circulatory and motor function intact.  X-rays were obtained showing a acute nondisplaced fracture of the proximal shaft of the left fourth metatarsal bone.  Patient was offered postop shoe or walking boot and requested walking boot so she can continue to work.  She was also given crutches for comfort.  Discussed over-the-counter pain medicine  and she stated her understanding.  Orthopedic follow-up given.  Return precautions discussed.   Final Clinical Impressions(s) / ED Diagnoses   Final diagnoses:  Closed nondisplaced fracture of fourth metatarsal bone of left foot, initial encounter    ED Discharge Orders        Ordered    HYDROcodone-acetaminophen (NORCO/VICODIN) 5-325 MG tablet  Every 6 hours PRN     12/05/17 1409       Cristina Gong, PA-C 12/05/17 1410    Shaune Pollack, MD 12/06/17 1056

## 2017-12-05 NOTE — ED Triage Notes (Signed)
Pt presents for evaluation of L foot pain since Tuesday. Pt reports heard crack when she rolled ankle in kitchen. Pt has difficulty with ambulation, ongoing pain, and some swelling.

## 2017-12-05 NOTE — ED Notes (Signed)
Ortho tech applying cam  Walker and crutches

## 2017-12-05 NOTE — Discharge Instructions (Addendum)
Please take Ibuprofen (Advil, motrin) and Tylenol (acetaminophen) to relieve your pain.  You may take up to 600 MG (3 pills) of normal strength ibuprofen every 8 hours as needed.  In between doses of ibuprofen you make take tylenol, up to 1,000 mg (two extra strength pills).  Do not take more than 3,000 mg tylenol in a 24 hour period.  Please check all medication labels as many medications such as pain and cold medications may contain tylenol.  Do not drink alcohol while taking these medications.  Do not take other NSAID'S while taking ibuprofen (such as aleve or naproxen).  Please take ibuprofen with food to decrease stomach upset.  You are being prescribed a medication which may make you sleepy. For 24 hours after one dose please do not drive, operate heavy machinery, care for a small child with out another adult present, or perform any activities that may cause harm to you or someone else if you were to fall asleep or be impaired.  Please do not take this medication if you are breastfeeding.  Please take this medication for as short a time as possible.  This medication is considered addicting.  Please make sure that you are increasing your fiber intake and taking MiraLAX if you choose to take this prescription.

## 2017-12-05 NOTE — Progress Notes (Signed)
Orthopedic Tech Progress Note Patient Details:  Gabriela ReapCarolyn Walters November 07, 1990 [id number]  Ortho Devices Type of Ortho Device: CAM walker, Crutches Ortho Device/Splint Location: Left foot Ortho Device/Splint Interventions: Application, Adjustment   Post Interventions Patient Tolerated: Well, Ambulated well Instructions Provided: Adjustment of device, Care of device, Poper ambulation with device   Gabriela ChouWilliams, [name] C 12/05/2017, 2:23 PM

## 2018-10-16 ENCOUNTER — Emergency Department (HOSPITAL_COMMUNITY)
Admission: EM | Admit: 2018-10-16 | Discharge: 2018-10-16 | Disposition: A | Discharge status: Home / Self Care | Payer: Medicaid Other | Attending: Emergency Medicine | Admitting: Emergency Medicine

## 2018-10-16 ENCOUNTER — Other Ambulatory Visit: Payer: Self-pay

## 2018-10-16 ENCOUNTER — Encounter (HOSPITAL_COMMUNITY): Payer: Self-pay

## 2018-10-16 DIAGNOSIS — R69 Illness, unspecified: Secondary | ICD-10-CM

## 2018-10-16 DIAGNOSIS — Z79899 Other long term (current) drug therapy: Secondary | ICD-10-CM | POA: Insufficient documentation

## 2018-10-16 DIAGNOSIS — Z20828 Contact with and (suspected) exposure to other viral communicable diseases: Secondary | ICD-10-CM

## 2018-10-16 DIAGNOSIS — J069 Acute upper respiratory infection, unspecified: Secondary | ICD-10-CM | POA: Insufficient documentation

## 2018-10-16 DIAGNOSIS — J111 Influenza due to unidentified influenza virus with other respiratory manifestations: Secondary | ICD-10-CM

## 2018-10-16 DIAGNOSIS — J101 Influenza due to other identified influenza virus with other respiratory manifestations: Secondary | ICD-10-CM | POA: Insufficient documentation

## 2018-10-16 MED ORDER — ALBUTEROL SULFATE HFA 108 (90 BASE) MCG/ACT IN AERS
2.0000 | INHALATION_SPRAY | Freq: Once | RESPIRATORY_TRACT | Status: AC
  Administered 2018-10-16: 2 via RESPIRATORY_TRACT
  Filled 2018-10-16: qty 6.7

## 2018-10-16 MED ORDER — OSELTAMIVIR PHOSPHATE 75 MG PO CAPS: 75.0000 mg | ORAL_CAPSULE | Freq: Two times a day (BID) | ORAL | 0 refills | Status: AC

## 2018-10-16 NOTE — ED Notes (Signed)
Patient verbalizes understanding of discharge instructions. Opportunity for questioning and answers were provided. Armband removed by staff, pt discharged from ED. Pt ambulatory to lobby.  

## 2018-10-16 NOTE — ED Notes (Signed)
ED Provider at bedside. 

## 2018-10-16 NOTE — Discharge Instructions (Signed)
Your history and exam today are consistent with influenza B while carbonara.  As her symptoms have began within the last 40 hours, will prescribe you Tamiflu like her daughter.  Please take this medicine for the next 5 days.  Please stay hydrated.  Please rest.  If any symptoms change or worsen, please return to the nearest emergency department.  Please follow-up with your PCP.

## 2018-10-16 NOTE — ED Triage Notes (Signed)
Pt here fro cough and flu like symptoms times 2 days . Daughter just dx with flu

## 2018-10-16 NOTE — ED Provider Notes (Signed)
[facility] Provider Note   CSN: [id number] Arrival date & time: 10/16/18  1630     History   Chief Complaint Chief Complaint  Patient presents with  . Cough  . Sore Throat    HPI Gabriela ReapCarolyn Walters is a 28 y.o. female.  The history is provided by the patient, medical records and a relative. No language interpreter was used.  URI  Presenting symptoms: congestion, cough, fatigue, fever and rhinorrhea   Severity:  Moderate Onset quality:  Gradual Duration:  2 days Timing:  Constant Progression:  Waxing and waning Chronicity:  New Relieved by:  Nothing Worsened by:  Nothing Ineffective treatments:  None tried Associated symptoms: wheezing   Associated symptoms: no headaches and no neck pain   Risk factors: sick contacts     Past Medical History:  Diagnosis Date  . Gestational diabetes   . Headache   . Seasonal allergies     Patient Active Problem List   Diagnosis Date Noted  . Request for sterilization 05/16/2016  . Active labor 05/15/2016  . NSVD (normal spontaneous vaginal delivery) 05/15/2016    Past Surgical History:  Procedure Laterality Date  . TUBAL LIGATION Bilateral 05/16/2016   Procedure: POST PARTUM TUBAL LIGATION;  Surgeon: Edwinna Areolaecilia Worema Banga, DO;  Location: [facility];  Service: Gynecology;  Laterality: Bilateral;  . WISDOM TOOTH EXTRACTION       OB History    Gravida  3   Para  2   Term  2   Preterm      AB      Living  2     SAB      TAB      Ectopic      Multiple      Live Births  2            Home Medications    Prior to Admission medications   Medication Sig Start Date End Date Taking? Authorizing Provider  acetaminophen (TYLENOL) 325 MG tablet Take 650 mg by mouth every 6 (six) hours as needed for headache.    [provider]  calcium carbonate (TUMS - DOSED IN MG ELEMENTAL CALCIUM) 500 MG chewable tablet Chew 2 tablets by mouth at bedtime as needed for indigestion or  heartburn.    [provider]  HYDROcodone-acetaminophen (NORCO/VICODIN) 5-325 MG tablet Take 1 tablet by mouth every 6 (six) hours as needed. 12/05/17   Cristina GongHammond, Elizabeth W, PA-C  ibuprofen (ADVIL,MOTRIN) 800 MG tablet Take 1 tablet (800 mg total) by mouth every 8 (eight) hours as needed for moderate pain. 05/17/16   Bovard-Stuckert, Augusto GambleJody, MD  oxyCODONE-acetaminophen (PERCOCET/ROXICET) 5-325 MG tablet Take 1 tablet by mouth every 6 (six) hours as needed (pain scale 4-7). 05/17/16   Bovard-Stuckert, Augusto GambleJody, MD  Prenatal Vit-Fe Fumarate-FA (PRENATAL MULTIVITAMIN) TABS tablet Take 1 tablet by mouth daily at 12 noon. 05/17/16   Bovard-StuckertAugusto Gamble, Jody, MD    Family History Family History  Problem Relation Age of Onset  . Alcohol abuse Neg Hx   . Arthritis Neg Hx   . Asthma Neg Hx   . Birth defects Neg Hx   . Cancer Neg Hx   . COPD Neg Hx   . Depression Neg Hx   . Diabetes Neg Hx   . Drug abuse Neg Hx   . Early death Neg Hx   . Hearing loss Neg Hx   . Heart disease Neg Hx   . Hyperlipidemia Neg Hx   .  Hypertension Neg Hx   . Kidney disease Neg Hx   . Learning disabilities Neg Hx   . Mental illness Neg Hx   . Mental retardation Neg Hx   . Miscarriages / Stillbirths Neg Hx   . Stroke Neg Hx   . Vision loss Neg Hx   . Varicose Veins Neg Hx     Social History Social History   Tobacco Use  . Smoking status: Never Smoker  . Smokeless tobacco: Never Used  Substance Use Topics  . Alcohol use: No  . Drug use: No     Allergies   Patient has no known allergies.   Review of Systems Review of Systems  Constitutional: Positive for chills, fatigue and fever. Negative for diaphoresis.  HENT: Positive for congestion and rhinorrhea.   Respiratory: Positive for cough and wheezing. Negative for chest tightness, shortness of breath and stridor.   Cardiovascular: Negative for chest pain, palpitations and leg swelling.  Gastrointestinal: Positive for diarrhea and nausea. Negative for  abdominal pain, constipation and vomiting.  Genitourinary: Negative for dysuria.  Musculoskeletal: Negative for back pain, neck pain and neck stiffness.  Skin: Negative for rash and wound.  Neurological: Negative for dizziness, light-headedness and headaches.  Psychiatric/Behavioral: Negative for agitation.  All other systems reviewed and are negative.    Physical Exam Updated Vital Signs BP (!) 131/112 (BP Location: Right Arm)   Pulse (!) 116   Temp 99.8 F (37.7 C) (Oral)   Resp 19   SpO2 98%   Physical Exam Vitals signs and nursing note reviewed.  Constitutional:      General: She is not in acute distress.    Appearance: She is well-developed. She is not ill-appearing, toxic-appearing or diaphoretic.  HENT:     Head: Normocephalic and atraumatic.     Nose: Congestion and rhinorrhea present.     Mouth/Throat:     Pharynx: No oropharyngeal exudate or posterior oropharyngeal erythema.  Eyes:     Conjunctiva/sclera: Conjunctivae normal.     Pupils: Pupils are equal, round, and reactive to light.  Neck:     Musculoskeletal: Neck supple.  Cardiovascular:     Rate and Rhythm: Regular rhythm. Tachycardia present.     Pulses: Normal pulses.     Heart sounds: No murmur.  Pulmonary:     Effort: Pulmonary effort is normal. No respiratory distress.     Breath sounds: Wheezing present. No rhonchi or rales.  Chest:     Chest wall: No tenderness.  Abdominal:     Palpations: Abdomen is soft.     Tenderness: There is no abdominal tenderness.  Musculoskeletal:        General: No swelling or tenderness.     Right lower leg: No edema.     Left lower leg: No edema.  Skin:    General: Skin is warm and dry.     Capillary Refill: Capillary refill takes less than 2 seconds.     Findings: No erythema.  Neurological:     General: No focal deficit present.     Mental Status: She is alert and oriented to person, place, and time.     Sensory: No sensory deficit.     Motor: No weakness.       Gait: Gait normal.  Psychiatric:        Mood and Affect: Mood normal.      ED Treatments / Results  Labs (all labs ordered are listed, but only abnormal results are displayed) Labs Reviewed -  No data to display  EKG None  Radiology No results found.  Procedures Procedures (including critical care time)  Medications Ordered in ED Medications  albuterol (PROVENTIL HFA;VENTOLIN HFA) 108 (90 Base) MCG/ACT inhaler 2 puff (2 puffs Inhalation Given 10/16/18 1825)     Initial Impression / Assessment and Plan / ED Course  I have reviewed the triage vital signs and the nursing notes.  Pertinent labs & imaging results that were available during my care of the patient were reviewed by me and considered in my medical decision making (see chart for details).     [name] is a 28 y.o. female with no significant past medical history who presents with fevers, chills, congestion, cough, malaise, myalgias for the last 24 hours.  Patient reports that her symptoms get yesterday and her daughter was diagnosed with influenza earlier today.  She reports that she has been feeling bad since yesterday when her symptoms worsen.  She denies any emesis but does report some nausea.  Mild diarrhea.  No neck pain or neck stiffness.  No recent injuries.  No significant shortness of breath, just cough.  Patient thinks she has the flu.  On exam, lungs had no rhonchi but she had very faint wheezing.  Patient's chest was nontender.  Abdomen was nontender.  Rhinorrhea and congestion was appreciated.  Exam otherwise unremarkable.  Patient was not tachycardic or hypoxic.  For the wheezing, patient was given 2 puffs of albuterol with significant provement.  Patient is breathing much better and feeling better.  Given her known exposure to influenza and her symptoms started within the last 48 hours, patient will given prescription for Tamiflu.  Patient says she is no longer feeling nauseous and does not want  prescription for nausea medicine.  She will follow-up with her PCP for further management and understood return precautions.  Given her lack of productive cough, hypoxia, and her current well appearance, she agrees with holding on any x-ray at this time.  Suspect pain causing her symptoms.  Given the known exposure and diagnosis of similar in the family, do not feel it is necessary to retest the patient at this time.  Patient agrees.  Patient given prescription for Tamiflu and will be discharged.  Patient discharged in good condition.   Final Clinical Impressions(s) / ED Diagnoses   Final diagnoses:  Upper respiratory tract infection, unspecified type  Influenza-like illness  Exposure to influenza    ED Discharge Orders         Ordered    oseltamivir (TAMIFLU) 75 MG capsule  Every 12 hours     10/16/18 1835          Clinical Impression: 1. Upper respiratory tract infection, unspecified type   2. Influenza-like illness   3. Exposure to influenza     Disposition: Discharge  Condition: Good  I have discussed the results, Dx and Tx plan with the pt(& family if present). He/she/they expressed understanding and agree(s) with the plan. Discharge instructions discussed at great length. Strict return precautions discussed and pt &/or family have verbalized understanding of the instructions. No further questions at time of discharge.    Discharge Medication List as of 10/16/2018  6:36 PM    START taking these medications   Details  oseltamivir (TAMIFLU) 75 MG capsule Take 1 capsule (75 mg total) by mouth every 12 (twelve) hours., Starting Fri 10/16/2018, Print        Follow Up: [facility] [address] [place]  [place] [place] [phone] Schedule an appointment as soon as possible for a visit    [facility] [address] [id number] [place]  [id number] [phone]       Shaunak Kreis, Canary Brimhristopher J, MD 10/16/18 [phone]

## 2019-09-23 ENCOUNTER — Ambulatory Visit: Payer: Medicaid Other | Attending: Internal Medicine

## 2019-09-23 DIAGNOSIS — Z20822 Contact with and (suspected) exposure to covid-19: Secondary | ICD-10-CM

## 2019-09-25 LAB — NOVEL CORONAVIRUS, NAA: SARS-CoV-2, NAA: NOT DETECTED

## 2019-09-28 IMAGING — CR DG FOOT COMPLETE 3+V*L*
3 series · 3 of 3 positions shown · non-contrast
Comparison: None.

CLINICAL DATA: Left lateral ankle pain and swelling.

EXAM:
LEFT FOOT - COMPLETE 3+ VIEW

[foot ap]
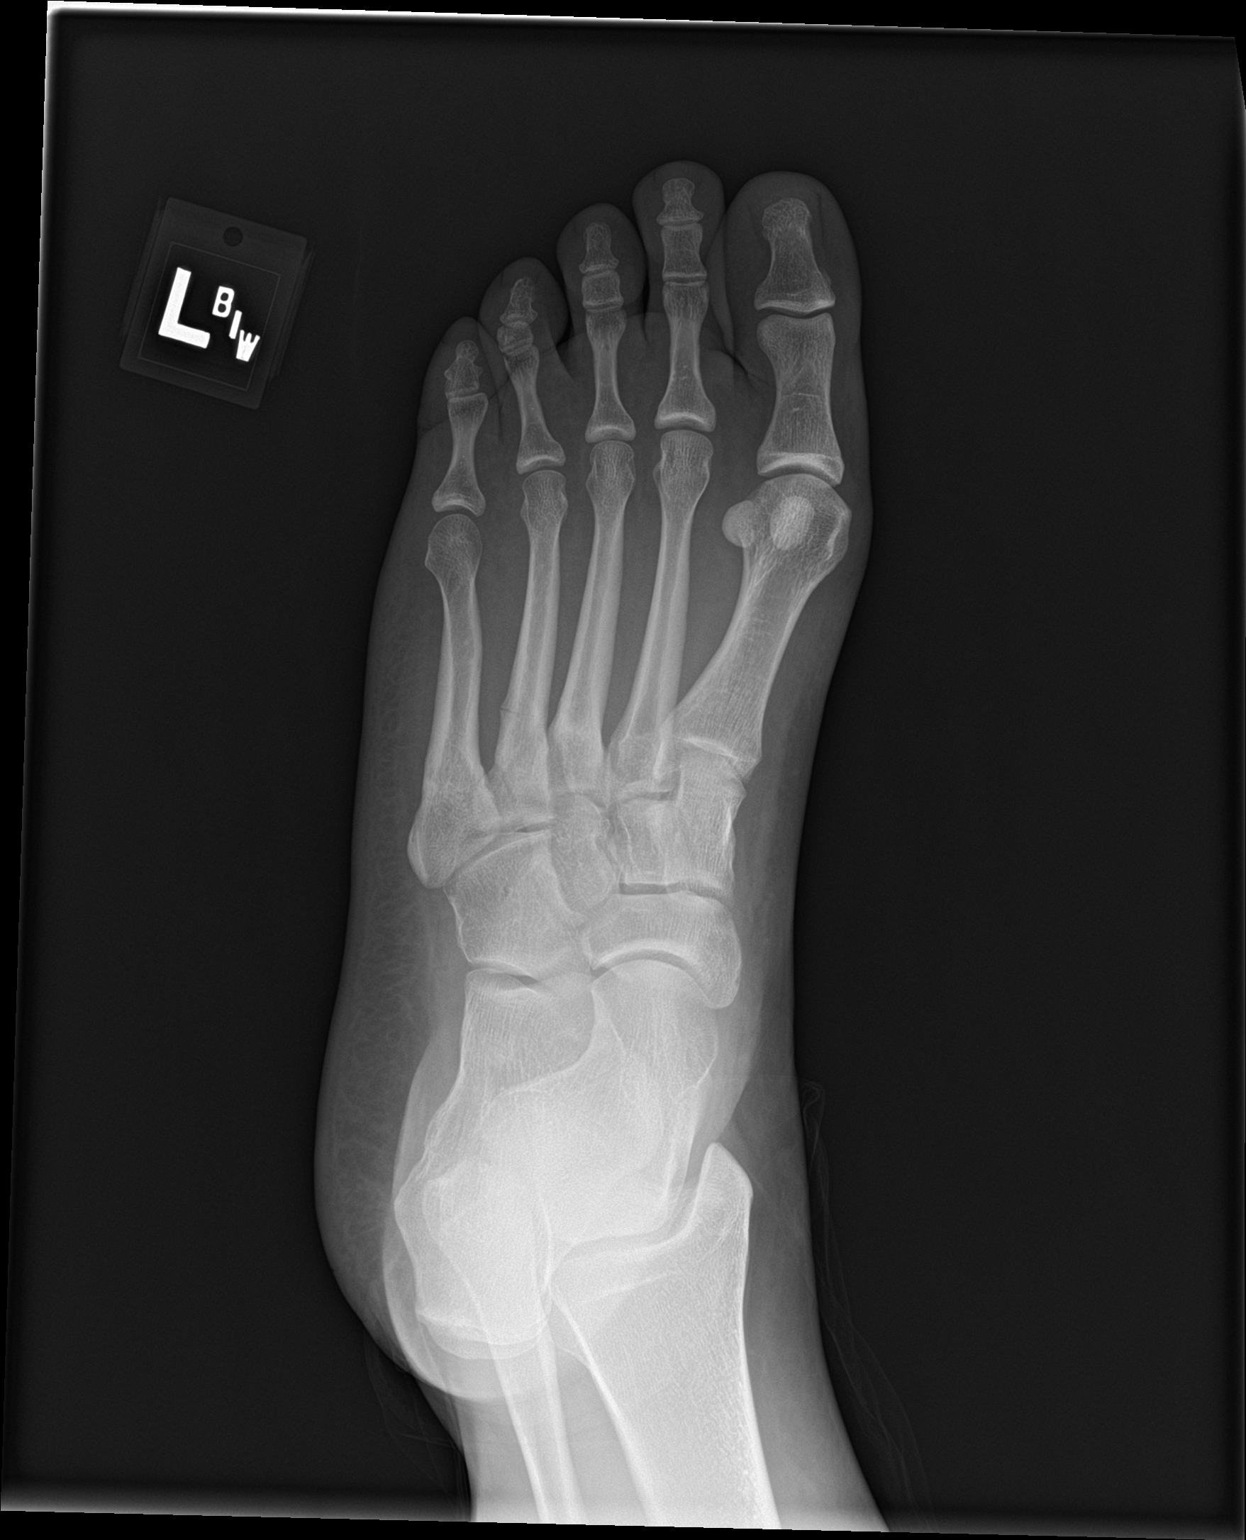

[foot obl]
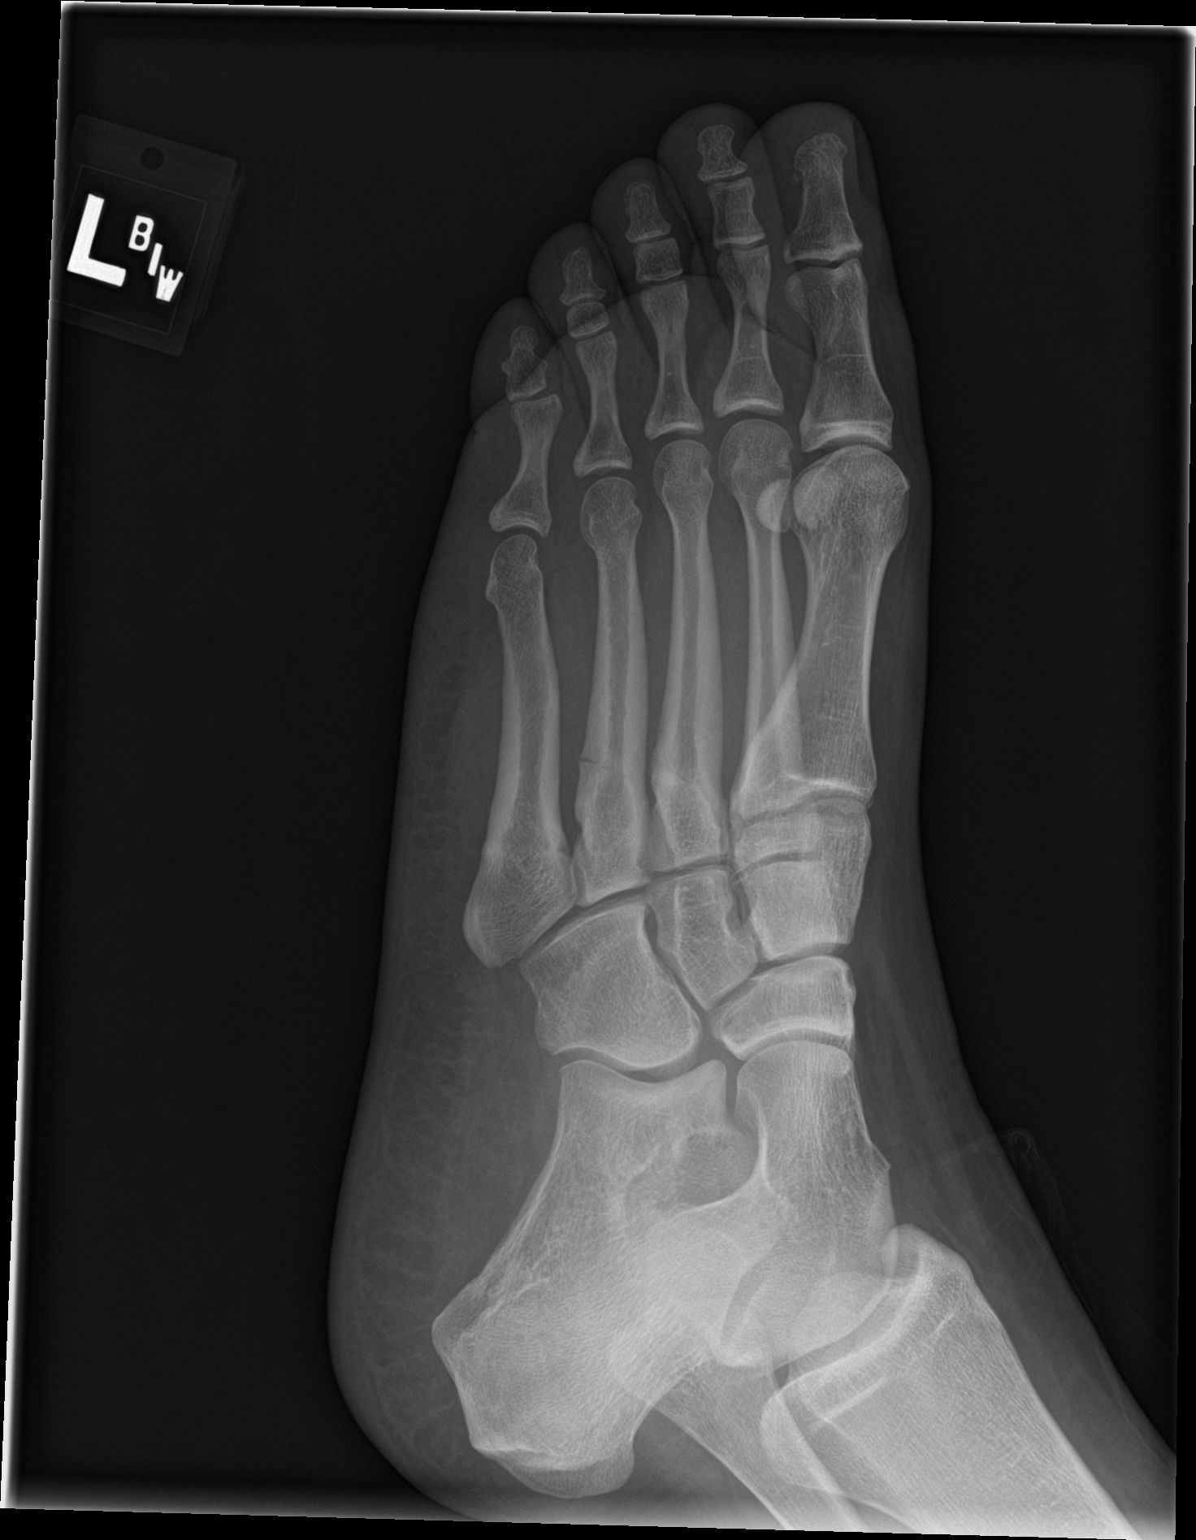

[foot lat]
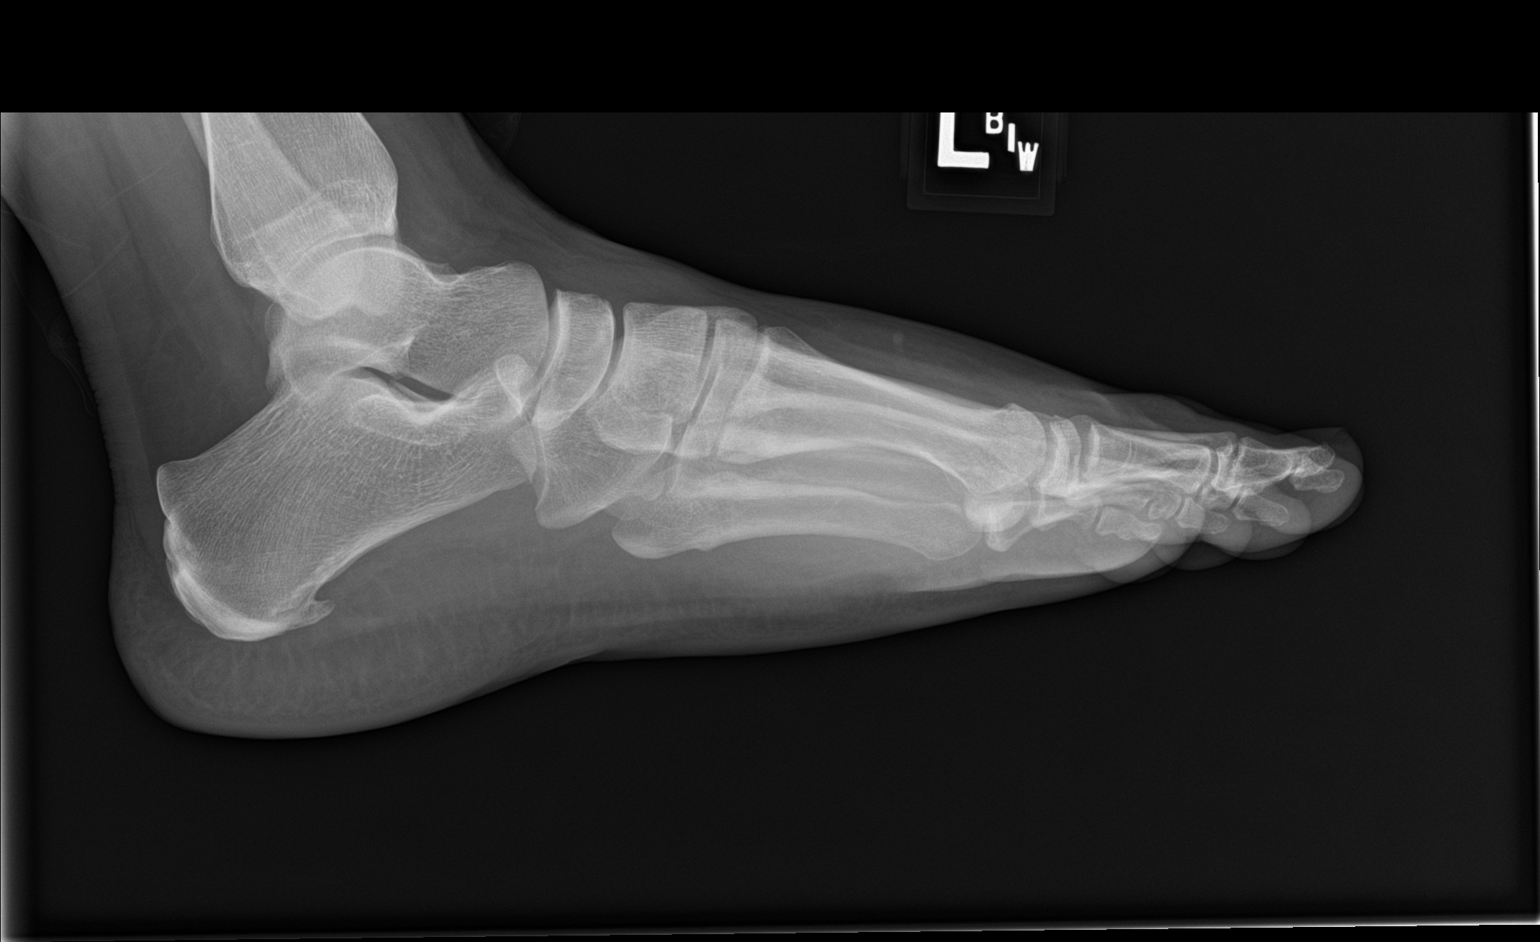

[3 of 3 positions shown; findings below may reference images not displayed]

FINDINGS: Acute nondisplaced fracture of the proximal shaft of the fourth
metatarsal bone. No significant angulation. Associated soft tissue
swelling. Normal bone mineralization.
IMPRESSION: Acute nondisplaced fracture of the proximal shaft of the left fourth
metatarsal bone.
